# Patient Record
Sex: Male | Born: 1980 | Race: Black or African American | Hispanic: No | Marital: Single | Smoking: Never smoker
Health system: Southern US, Community
[De-identification: ages and names within clinical notes are randomized; demographics above are authoritative.]

## PROBLEM LIST (undated history)

## (undated) DIAGNOSIS — J40 Bronchitis, not specified as acute or chronic: Secondary | ICD-10-CM

---

## 2019-05-17 ENCOUNTER — Other Ambulatory Visit: Payer: Self-pay

## 2019-05-17 ENCOUNTER — Emergency Department (HOSPITAL_BASED_OUTPATIENT_CLINIC_OR_DEPARTMENT_OTHER): Payer: HRSA Program

## 2019-05-17 ENCOUNTER — Encounter (HOSPITAL_BASED_OUTPATIENT_CLINIC_OR_DEPARTMENT_OTHER): Payer: Self-pay | Admitting: Emergency Medicine

## 2019-05-17 ENCOUNTER — Emergency Department (HOSPITAL_BASED_OUTPATIENT_CLINIC_OR_DEPARTMENT_OTHER)
Admission: EM | Admit: 2019-05-17 | Discharge: 2019-05-17 | Disposition: A | Discharge status: Home / Self Care | Payer: HRSA Program | Attending: Emergency Medicine | Admitting: Emergency Medicine

## 2019-05-17 DIAGNOSIS — J069 Acute upper respiratory infection, unspecified: Secondary | ICD-10-CM

## 2019-05-17 DIAGNOSIS — U071 COVID-19: Secondary | ICD-10-CM | POA: Insufficient documentation

## 2019-05-17 DIAGNOSIS — R531 Weakness: Secondary | ICD-10-CM | POA: Insufficient documentation

## 2019-05-17 DIAGNOSIS — R05 Cough: Secondary | ICD-10-CM | POA: Diagnosis present

## 2019-05-17 HISTORY — DX: Bronchitis, not specified as acute or chronic: J40

## 2019-05-17 LAB — CBC WITH DIFFERENTIAL/PLATELET
Abs Immature Granulocytes: 0.02 10*3/uL (ref 0.00–0.07)
Basophils Absolute: 0 10*3/uL (ref 0.0–0.1)
Basophils Relative: 0 %
Eosinophils Absolute: 0 10*3/uL (ref 0.0–0.5)
Eosinophils Relative: 0 %
HCT: 49.2 % (ref 39.0–52.0)
Hemoglobin: 16.5 g/dL (ref 13.0–17.0)
Immature Granulocytes: 0 %
Lymphocytes Relative: 22 %
Lymphs Abs: 1.1 10*3/uL (ref 0.7–4.0)
MCH: 31.9 pg (ref 26.0–34.0)
MCHC: 33.5 g/dL (ref 30.0–36.0)
MCV: 95 fL (ref 80.0–100.0)
Monocytes Absolute: 0.7 10*3/uL (ref 0.1–1.0)
Monocytes Relative: 14 %
Neutro Abs: 3 10*3/uL (ref 1.7–7.7)
Neutrophils Relative %: 64 %
Platelets: 194 10*3/uL (ref 150–400)
RBC: 5.18 MIL/uL (ref 4.22–5.81)
RDW: 13.3 % (ref 11.5–15.5)
WBC: 4.7 10*3/uL (ref 4.0–10.5)
nRBC: 0 % (ref 0.0–0.2)

## 2019-05-17 LAB — BASIC METABOLIC PANEL
Anion gap: 10 (ref 5–15)
BUN: 11 mg/dL (ref 6–20)
CO2: 26 mmol/L (ref 22–32)
Calcium: 9.2 mg/dL (ref 8.9–10.3)
Chloride: 101 mmol/L (ref 98–111)
Creatinine, Ser: 1.36 mg/dL — ABNORMAL HIGH (ref 0.61–1.24)
GFR calc Af Amer: >60 mL/min (ref >60)
GFR calc non Af Amer: >60 mL/min (ref >60)
Glucose, Bld: 117 mg/dL — ABNORMAL HIGH (ref 70–99)
Potassium: 3.7 mmol/L (ref 3.5–5.1)
Sodium: 137 mmol/L (ref 135–145)

## 2019-05-17 NOTE — ED Triage Notes (Signed)
Pt c/o cough, weakness and chills x 4 days.  Pt denies exposure to others with illness. Pt has tried OTC medications without relief.

## 2019-05-17 NOTE — ED Provider Notes (Addendum)
[facility] EMERGENCY DEPARTMENT Provider Note   CSN: [id number] Arrival date & time: 05/17/19  1334    History   Chief Complaint Chief Complaint  Patient presents with  . Cough  . Weakness  . Chills    HPI Rodney Lutz is a 38 y.o. male.     38yo male presents with 4 days of cough (producitve- yellow sputum) with chills, sweats, has not checked temperature, body aches, congestion, sore throat (not currently). No sick contacts, no recent travel. Denies nausea, vomiting, diarrhea, changes in bladder habits, no changes in appetite, smell/taste, denies rash. Patient is taking Mucinex and Robitussin with limited relief. Last took Advil this morning (around 10AM).  Rodney GammaGar-Yon-Ded-Weh Bach was evaluated in Emergency Department on 05/17/2019 for the symptoms described in the history of present illness. He was evaluated in the context of the global COVID-19 pandemic, which necessitated consideration that the patient might be at risk for infection with the SARS-CoV-2 virus that causes COVID-19. Institutional protocols and algorithms that pertain to the evaluation of patients at risk for COVID-19 are in a state of rapid change based on information released by regulatory bodies including the CDC and federal and state organizations. These policies and algorithms were followed during the patient's care in the ED.      Past Medical History:  Diagnosis Date  . Bronchitis     There are no active problems to display for this patient.   History reviewed. No pertinent surgical history.      Home Medications    Prior to Admission medications   Medication Sig Start Date End Date Taking? Authorizing Provider  valACYclovir (VALTREX) 1000 MG tablet Take 1,000 mg by mouth as needed.   Yes [provider]    Family History No family history on file.  Social History Social History   Tobacco Use  . Smoking status: Never Smoker  . Smokeless tobacco: Never  Used  Substance Use Topics  . Alcohol use: Yes  . Drug use: Never     Allergies   Patient has no known allergies.   Review of Systems Review of Systems  Constitutional: Positive for chills and diaphoresis. Negative for appetite change and fatigue.  HENT: Positive for congestion and sore throat. Negative for sneezing.   Respiratory: Positive for cough. Negative for shortness of breath.   Cardiovascular: Negative for chest pain.  Gastrointestinal: Negative for abdominal pain, constipation, diarrhea, nausea and vomiting.  Genitourinary: Negative for dysuria, frequency and urgency.  Musculoskeletal: Positive for arthralgias and myalgias.  Skin: Negative for rash and wound.  Allergic/Immunologic: Negative for immunocompromised state.  Neurological: Negative for dizziness and weakness.  Hematological: Negative for adenopathy.  Psychiatric/Behavioral: Negative for confusion.  All other systems reviewed and are negative.    Physical Exam Updated Vital Signs BP (!) 128/106   Pulse 81   Temp 98.8 F (37.1 C) (Oral)   Resp 18   Ht 6' (1.829 m)   Wt 124.7 kg   SpO2 98%   BMI 37.30 kg/m   Physical Exam Vitals signs and nursing note reviewed.  Constitutional:      General: He is not in acute distress.    Appearance: He is well-developed. He is not diaphoretic.  HENT:     Head: Normocephalic and atraumatic.  Eyes:     Conjunctiva/sclera: Conjunctivae normal.  Neck:     Musculoskeletal: Neck supple.  Cardiovascular:     Rate and Rhythm: Normal rate and regular rhythm.     Pulses: Normal  pulses.     Heart sounds: Normal heart sounds.  Pulmonary:     Effort: Pulmonary effort is normal.     Breath sounds: Normal breath sounds.  Skin:    General: Skin is warm and dry.  Neurological:     Mental Status: He is alert and oriented to person, place, and time.  Psychiatric:        Behavior: Behavior normal.      ED Treatments / Results  Labs (all labs ordered are listed,  but only abnormal results are displayed) Labs Reviewed  BASIC METABOLIC PANEL - Abnormal; Notable for the following components:      Result Value   Glucose, Bld 117 (*)    Creatinine, Ser 1.36 (*)    All other components within normal limits  NOVEL CORONAVIRUS, NAA (HOSPITAL ORDER, SEND-OUT TO REF LAB)  CBC WITH DIFFERENTIAL/PLATELET    EKG None  Radiology Dg Chest Port 1 View  Result Date: 05/17/2019 CLINICAL DATA:  Cough, chills, fever and chest congestion for the past 4 days. EXAM: PORTABLE CHEST 1 VIEW COMPARISON:  None. FINDINGS: Poor inspiration. Borderline enlarged cardiac silhouette. Mildly prominent pulmonary vasculature. Clear lungs. Unremarkable bones. IMPRESSION: Borderline cardiomegaly and mild pulmonary vascular congestion. Electronically Signed   By: Claudie Revering M.D.   On: 05/17/2019 14:51    Procedures Procedures (including critical care time)  Medications Ordered in ED Medications - No data to display   Initial Impression / Assessment and Plan / ED Course  I have reviewed the triage vital signs and the nursing notes.  Pertinent labs & imaging results that were available during my care of the patient were reviewed by me and considered in my medical decision making (see chart for details).  Clinical Course as of May 17 1643  Sun Jul 19, 435  6627 38 year old male with history of viral bronchitis presents with complaint of cough x4 days, productive of yellow sputum.  Patient also reports chills and sweats, has not been checking his temperature, reports body aches, congestion and a mild sore throat earlier.  Patient last took Advil just prior to arrival.  Vital signs reviewed, patient is afebrile.  Lung sounds are clear, exam is unremarkable.  Patient denies sick contacts or travel, no known exposure to [place].  CBC is unremarkable, BMP with mildly elevated creatinine 1.36, no prior for comparison.   [LM]  4034 Chest x-ray shows borderline cardiomegaly and mild  pulmonary vascular congestion however this is a portable film. Patient will have send out COVID testing, advised to quarantine at home pending results and to monitor his temperature with a thermometer.  Return to ER for severe or concerning symptoms otherwise follow-up with PCP for routine care and consider repeat two-view chest x-ray if necessary.   [LM]    Clinical Course User Index [LM] Tacy Learn, PA-C      Final Clinical Impressions(s) / ED Diagnoses   Final diagnoses:  Viral URI with cough    ED Discharge Orders    None       Roque Lias 05/17/19 1644    Davonna Belling, MD 05/19/19 0701    Tacy Learn, PA-C 05/26/19 1203    Davonna Belling, MD 05/26/19 [phone]

## 2019-05-17 NOTE — Discharge Instructions (Addendum)
Your COVID test results should be available in 24-72 hours, your results can be found in your MyChart when available.  Use a thermometer to watch your temperature at home. You should stay home for 10 days since symptom onset and at least 72 hours fever free without taking fever reducing medications such as Advil, Tylenol, Ibuprofen.   Follow up with a primary care doctor, referral given if you do not have a primary care provider. Return to the ER for severe or concerning symptoms.

## 2019-05-20 LAB — NOVEL CORONAVIRUS, NAA (HOSP ORDER, SEND-OUT TO REF LAB; TAT 18-24 HRS): SARS-CoV-2, NAA: DETECTED — AB

## 2019-09-09 ENCOUNTER — Emergency Department (HOSPITAL_BASED_OUTPATIENT_CLINIC_OR_DEPARTMENT_OTHER): Payer: Self-pay

## 2019-09-09 ENCOUNTER — Encounter (HOSPITAL_BASED_OUTPATIENT_CLINIC_OR_DEPARTMENT_OTHER): Payer: Self-pay | Admitting: *Deleted

## 2019-09-09 ENCOUNTER — Other Ambulatory Visit: Payer: Self-pay

## 2019-09-09 ENCOUNTER — Emergency Department (HOSPITAL_BASED_OUTPATIENT_CLINIC_OR_DEPARTMENT_OTHER)
Admission: EM | Admit: 2019-09-09 | Discharge: 2019-09-09 | Disposition: A | Discharge status: Home / Self Care | Payer: Self-pay | Attending: Emergency Medicine | Admitting: Emergency Medicine

## 2019-09-09 DIAGNOSIS — R112 Nausea with vomiting, unspecified: Secondary | ICD-10-CM | POA: Insufficient documentation

## 2019-09-09 DIAGNOSIS — R55 Syncope and collapse: Secondary | ICD-10-CM | POA: Insufficient documentation

## 2019-09-09 DIAGNOSIS — U071 COVID-19: Secondary | ICD-10-CM | POA: Insufficient documentation

## 2019-09-09 DIAGNOSIS — R5383 Other fatigue: Secondary | ICD-10-CM | POA: Insufficient documentation

## 2019-09-09 DIAGNOSIS — R42 Dizziness and giddiness: Secondary | ICD-10-CM | POA: Insufficient documentation

## 2019-09-09 LAB — COMPREHENSIVE METABOLIC PANEL
ALT: 37 U/L (ref 0–44)
AST: 33 U/L (ref 15–41)
Albumin: 4.4 g/dL (ref 3.5–5.0)
Alkaline Phosphatase: 57 U/L (ref 38–126)
Anion gap: 12 (ref 5–15)
BUN: 16 mg/dL (ref 6–20)
CO2: 27 mmol/L (ref 22–32)
Calcium: 9.9 mg/dL (ref 8.9–10.3)
Chloride: 97 mmol/L — ABNORMAL LOW (ref 98–111)
Creatinine, Ser: 1.09 mg/dL (ref 0.61–1.24)
GFR calc Af Amer: >60 mL/min (ref >60)
GFR calc non Af Amer: >60 mL/min (ref >60)
Glucose, Bld: 109 mg/dL — ABNORMAL HIGH (ref 70–99)
Potassium: 3.9 mmol/L (ref 3.5–5.1)
Sodium: 136 mmol/L (ref 135–145)
Total Bilirubin: 0.9 mg/dL (ref 0.3–1.2)
Total Protein: 8.3 g/dL — ABNORMAL HIGH (ref 6.5–8.1)

## 2019-09-09 LAB — CBC WITH DIFFERENTIAL/PLATELET
Abs Immature Granulocytes: 0.05 10*3/uL (ref 0.00–0.07)
Basophils Absolute: 0 10*3/uL (ref 0.0–0.1)
Basophils Relative: 0 %
Eosinophils Absolute: 0 10*3/uL (ref 0.0–0.5)
Eosinophils Relative: 0 %
HCT: 46.8 % (ref 39.0–52.0)
Hemoglobin: 15.6 g/dL (ref 13.0–17.0)
Immature Granulocytes: 0 %
Lymphocytes Relative: 12 %
Lymphs Abs: 1.7 10*3/uL (ref 0.7–4.0)
MCH: 31 pg (ref 26.0–34.0)
MCHC: 33.3 g/dL (ref 30.0–36.0)
MCV: 93 fL (ref 80.0–100.0)
Monocytes Absolute: 1 10*3/uL (ref 0.1–1.0)
Monocytes Relative: 7 %
Neutro Abs: 10.8 10*3/uL — ABNORMAL HIGH (ref 1.7–7.7)
Neutrophils Relative %: 81 %
Platelets: 318 10*3/uL (ref 150–400)
RBC: 5.03 MIL/uL (ref 4.22–5.81)
RDW: 12.9 % (ref 11.5–15.5)
WBC: 13.5 10*3/uL — ABNORMAL HIGH (ref 4.0–10.5)
nRBC: 0 % (ref 0.0–0.2)

## 2019-09-09 LAB — LIPASE, BLOOD: Lipase: 16 U/L (ref 11–51)

## 2019-09-09 MED ORDER — SODIUM CHLORIDE 0.9 % IV BOLUS
1000.0000 mL | Freq: Once | INTRAVENOUS | Status: AC
  Administered 2019-09-09: 18:00:00 1000 mL via INTRAVENOUS

## 2019-09-09 MED ORDER — MECLIZINE HCL 25 MG PO TABS: 25.0000 mg | ORAL_TABLET | Freq: Three times a day (TID) | ORAL | 0 refills | Status: AC | PRN

## 2019-09-09 MED ORDER — MECLIZINE HCL 25 MG PO TABS
25.0000 mg | ORAL_TABLET | Freq: Once | ORAL | Status: AC
  Administered 2019-09-09: 18:00:00 25 mg via ORAL
  Filled 2019-09-09: qty 1

## 2019-09-09 NOTE — ED Notes (Signed)
Patient ambulated one lap around nursing station; states dizziness has improved. Gave patient gingerale and crackers.

## 2019-09-09 NOTE — Discharge Instructions (Signed)
Continue meclizine as needed for symptoms.  Stay well-hydrated and rested.  Please return to the ED if your symptoms worsen.

## 2019-09-09 NOTE — ED Triage Notes (Signed)
Pt c/o unwitnessed syncopal episode last pm, c/o dizziness and vomiting

## 2019-09-09 NOTE — ED Provider Notes (Signed)
[facility] EMERGENCY DEPARTMENT Provider Note   CSN: [id number] Arrival date & time: 09/09/19  1559     History   Chief Complaint Chief Complaint  Patient presents with  . Dizziness    HPI Rodney Lutz is a 38 y.o. male.     The history is provided by the patient.  Dizziness Quality:  Room spinning Severity:  Mild Onset quality:  Gradual Timing:  Intermittent Progression:  Waxing and waning Chronicity:  New Context: head movement   Relieved by:  Nothing Worsened by:  Nothing Associated symptoms: syncope (increased fatigue, was to tired to drive home from bar last night and slept in his car after feeling dizzy. Did not think he was drunk. Has felt tired and nauseau today.)   Associated symptoms: no blood in stool, no chest pain, no diarrhea, no headaches, no hearing loss, no palpitations, no shortness of breath, no tinnitus, no vision changes, no vomiting and no weakness   Risk factors: no hx of vertigo     Past Medical History:  Diagnosis Date  . Bronchitis     There are no active problems to display for this patient.   History reviewed. No pertinent surgical history.      Home Medications    Prior to Admission medications   Medication Sig Start Date End Date Taking? Authorizing Provider  meclizine (ANTIVERT) 25 MG tablet Take 1 tablet (25 mg total) by mouth 3 (three) times daily as needed for dizziness. 09/09/19   Kae Lauman, DO  valACYclovir (VALTREX) 1000 MG tablet Take 1,000 mg by mouth as needed.    [provider]    Family History History reviewed. No pertinent family history.  Social History Social History   Tobacco Use  . Smoking status: Never Smoker  . Smokeless tobacco: Never Used  Substance Use Topics  . Alcohol use: Yes  . Drug use: Never     Allergies   Patient has no known allergies.   Review of Systems Review of Systems  Constitutional: Positive for fatigue. Negative for chills and fever.   HENT: Negative for ear pain, hearing loss, sore throat and tinnitus.   Eyes: Negative for pain and visual disturbance.  Respiratory: Negative for cough and shortness of breath.   Cardiovascular: Positive for syncope (increased fatigue, was to tired to drive home from bar last night and slept in his car after feeling dizzy. Did not think he was drunk. Has felt tired and nauseau today.). Negative for chest pain and palpitations.  Gastrointestinal: Negative for abdominal pain, blood in stool, diarrhea and vomiting.  Genitourinary: Negative for dysuria and hematuria.  Musculoskeletal: Negative for arthralgias and back pain.  Skin: Negative for color change and rash.  Neurological: Positive for dizziness. Negative for tremors, seizures, syncope, facial asymmetry, speech difficulty, weakness, light-headedness, numbness and headaches.  All other systems reviewed and are negative.    Physical Exam Updated Vital Signs  ED Triage Vitals  Enc Vitals Group     BP 09/09/19 1607 (!) 137/95     Pulse Rate 09/09/19 1607 90     Resp 09/09/19 1607 16     Temp 09/09/19 1607 98.9 F (37.2 C)     Temp src --      SpO2 09/09/19 1607 99 %     Weight 09/09/19 1605 275 lb (124.7 kg)     Height 09/09/19 1605 6\' 1"  (1.854 m)     Head Circumference --      Peak Flow --  Pain Score 09/09/19 1605 0     Pain Loc --      Pain Edu? --      Excl. in GC? --     Physical Exam Vitals signs and nursing note reviewed.  Constitutional:      General: He is not in acute distress.    Appearance: He is well-developed. He is not ill-appearing.  HENT:     Head: Normocephalic and atraumatic.     Right Ear: Tympanic membrane normal.     Left Ear: Tympanic membrane normal.     Mouth/Throat:     Mouth: Mucous membranes are moist.  Eyes:     Extraocular Movements: Extraocular movements intact.     Conjunctiva/sclera: Conjunctivae normal.     Pupils: Pupils are equal, round, and reactive to light.  Neck:      Musculoskeletal: Neck supple.  Cardiovascular:     Rate and Rhythm: Normal rate and regular rhythm.     Heart sounds: No murmur.  Pulmonary:     Effort: Pulmonary effort is normal. No respiratory distress.     Breath sounds: Normal breath sounds.  Abdominal:     Palpations: Abdomen is soft.     Tenderness: There is no abdominal tenderness.  Skin:    General: Skin is warm and dry.  Neurological:     General: No focal deficit present.     Mental Status: He is alert and oriented to person, place, and time.     Cranial Nerves: No cranial nerve deficit.     Sensory: No sensory deficit.     Motor: No weakness.     Coordination: Coordination normal.     Gait: Gait normal.     Comments: 5+ out of 5 strength throughout, normal sensation, normal finger-to-nose finger, no drift, normal heel-to-shin, normal gait      ED Treatments / Results  Labs (all labs ordered are listed, but only abnormal results are displayed) Labs Reviewed  CBC WITH DIFFERENTIAL/PLATELET - Abnormal; Notable for the following components:      Result Value   WBC 13.5 (*)    Neutro Abs 10.8 (*)    All other components within normal limits  COMPREHENSIVE METABOLIC PANEL - Abnormal; Notable for the following components:   Chloride 97 (*)    Glucose, Bld 109 (*)    Total Protein 8.3 (*)    All other components within normal limits  LIPASE, BLOOD    EKG EKG Interpretation  Date/Time:  Wednesday September 09 2019 16:11:33 EST Ventricular Rate:  80 PR Interval:  170 QRS Duration: 94 QT Interval:  382 QTC Calculation: 440 R Axis:   58 Text Interpretation: Normal sinus rhythm Normal ECG Confirmed by Virgina Norfolk [phone]) on 09/09/2019 4:20:42 PM   Radiology Ct Head Wo Contrast  Result Date: 09/09/2019 CLINICAL DATA:  Vertigo, episodically, peripheral. Unwitnessed syncopal episode last evening. Dizziness and vomiting. EXAM: CT HEAD WITHOUT CONTRAST TECHNIQUE: Contiguous axial images were obtained from the  base of the skull through the vertex without intravenous contrast. COMPARISON:  None. FINDINGS: Brain: No intracranial hemorrhage, mass effect, or midline shift. No hydrocephalus. The basilar cisterns are patent. No evidence of territorial infarct or acute ischemia. No extra-axial or intracranial fluid collection. Vascular: No hyperdense vessel or unexpected calcification. Skull: No fracture or focal lesion. Sinuses/Orbits: Paranasal sinuses and mastoid air cells are clear. The visualized orbits are unremarkable. Other: None. IMPRESSION: Unremarkable noncontrast head CT. Electronically Signed   By: Ivette Loyal.D.  On: 09/09/2019 18:54    Procedures Procedures (including critical care time)  Medications Ordered in ED Medications  sodium chloride 0.9 % bolus 1,000 mL (0 mLs Intravenous Stopped 09/09/19 1954)  meclizine (ANTIVERT) tablet 25 mg (25 mg Oral Given 09/09/19 1801)     Initial Impression / Assessment and Plan / ED Course  I have reviewed the triage vital signs and the nursing notes.  Pertinent labs & imaging results that were available during my care of the patient were reviewed by me and considered in my medical decision making (see chart for details).     Vickey SagesGar-Yon-Ded-Weh Christen BameStepney is a [age] year old male with no significant medical history presents the ED with feeling of generalized fatigue, dizziness.  Patient with symptoms since last night.  States that he was at a bar drinking when he went back to his car he felt kind of lightheaded and dizzy and fell asleep in his car for several hours.  He woke up still not feeling well.  Had 2 episodes of emesis.  Has continued to have some lethargy throughout the day.  Some dizziness.  Seems to get worse with head movement.  Possibly vertigo.  Has normal neurological exam.  Is able to ambulate without much issue.  No concern for stroke at this time.  Possibly peripheral vertigo versus dehydration versus condition related to alcohol.  Denies  any drug use.  Lab work showed no significant anemia, electrolyte abnormality, kidney injury.  Did have Covid several months ago.  But has felt well since.  Does not have any respiratory or GI symptoms.  No cough.  CT scan of the head was unremarkable.  Patient felt much improved after IV fluids and meclizine.  Overall may be mild vertigo versus alcohol related.  Low concern for stroke at this time.  Given return precautions and discharged in ED in good condition.  EKG showed sinus rhythm.  No chest pain.  Doubt cardiac process.  This chart was dictated using voice recognition software.  Despite best efforts to proofread,  errors can occur which can change the documentation meaning.    Final Clinical Impressions(s) / ED Diagnoses   Final diagnoses:  Dizziness    ED Discharge Orders         Ordered    meclizine (ANTIVERT) 25 MG tablet  3 times daily PRN     09/09/19 2100           Virgina NorfolkCuratolo, Iverson Sees, DO 09/09/19 2102

## 2021-01-23 IMAGING — DX PORTABLE CHEST - 1 VIEW
1 series · 1 of 1 positions shown · non-contrast
Comparison: None.

CLINICAL DATA: Cough, chills, fever and chest congestion for the
past 4 days.

EXAM:
PORTABLE CHEST 1 VIEW

[chest ap]
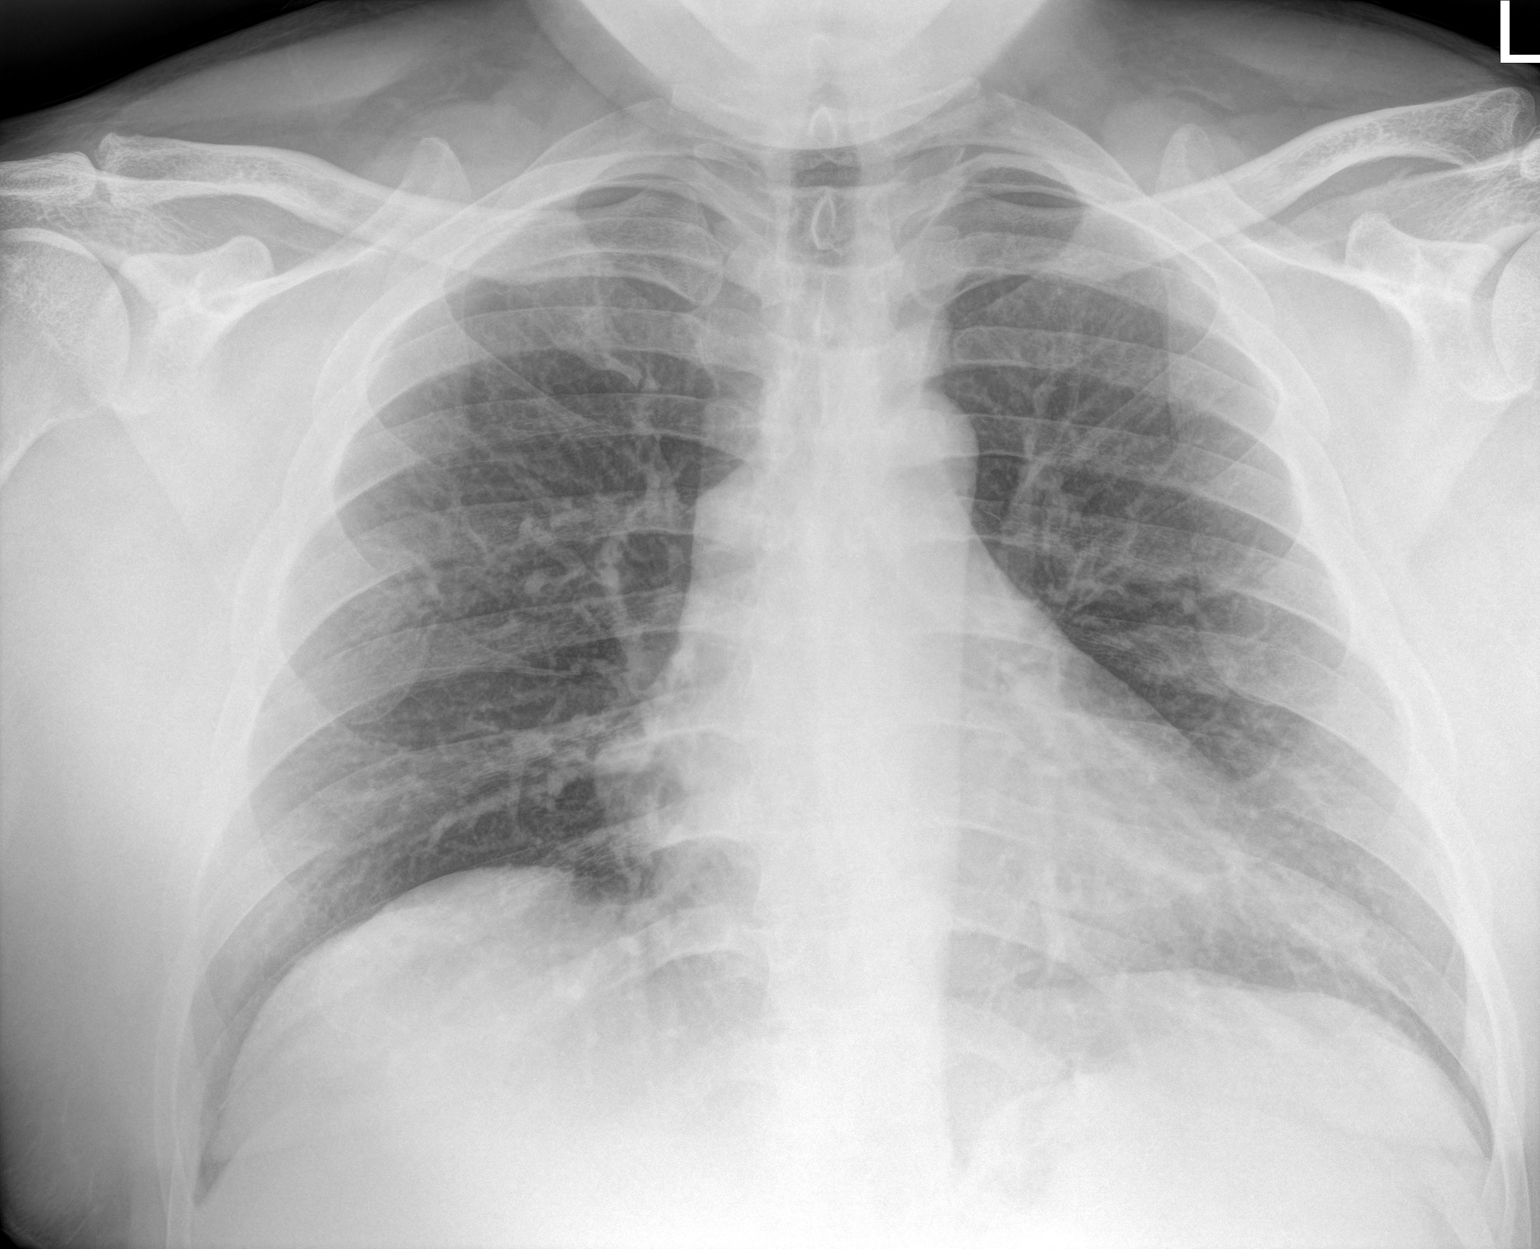

[1 of 1 positions shown; findings below may reference images not displayed]

FINDINGS: Poor inspiration. Borderline enlarged cardiac silhouette. Mildly
prominent pulmonary vasculature. Clear lungs. Unremarkable bones.
IMPRESSION: Borderline cardiomegaly and mild pulmonary vascular congestion.

## 2024-05-06 ENCOUNTER — Emergency Department (HOSPITAL_BASED_OUTPATIENT_CLINIC_OR_DEPARTMENT_OTHER): Payer: Self-pay

## 2024-05-06 ENCOUNTER — Encounter (HOSPITAL_BASED_OUTPATIENT_CLINIC_OR_DEPARTMENT_OTHER): Payer: Self-pay | Admitting: Emergency Medicine

## 2024-05-06 ENCOUNTER — Other Ambulatory Visit: Payer: Self-pay

## 2024-05-06 ENCOUNTER — Emergency Department (HOSPITAL_BASED_OUTPATIENT_CLINIC_OR_DEPARTMENT_OTHER)
Admission: EM | Admit: 2024-05-06 | Discharge: 2024-05-06 | Disposition: A | Discharge status: Home / Self Care | Payer: Self-pay

## 2024-05-06 DIAGNOSIS — Z79899 Other long term (current) drug therapy: Secondary | ICD-10-CM | POA: Diagnosis not present

## 2024-05-06 DIAGNOSIS — I10 Essential (primary) hypertension: Secondary | ICD-10-CM | POA: Insufficient documentation

## 2024-05-06 DIAGNOSIS — R42 Dizziness and giddiness: Secondary | ICD-10-CM | POA: Diagnosis present

## 2024-05-06 DIAGNOSIS — G629 Polyneuropathy, unspecified: Secondary | ICD-10-CM | POA: Diagnosis not present

## 2024-05-06 LAB — PROTIME-INR
INR: 1 (ref 0.8–1.2)
Prothrombin Time: 13.2 s (ref 11.4–15.2)

## 2024-05-06 LAB — CBC
HCT: 43.2 % (ref 39.0–52.0)
Hemoglobin: 15.2 g/dL (ref 13.0–17.0)
MCH: 35.9 pg — ABNORMAL HIGH (ref 26.0–34.0)
MCHC: 35.2 g/dL (ref 30.0–36.0)
MCV: 102.1 fL — ABNORMAL HIGH (ref 80.0–100.0)
Platelets: 390 K/uL (ref 150–400)
RBC: 4.23 MIL/uL (ref 4.22–5.81)
RDW: 14.3 % (ref 11.5–15.5)
WBC: 9.5 K/uL (ref 4.0–10.5)
nRBC: 0 % (ref 0.0–0.2)

## 2024-05-06 LAB — URINALYSIS, MICROSCOPIC (REFLEX)

## 2024-05-06 LAB — COMPREHENSIVE METABOLIC PANEL WITH GFR
ALT: 43 U/L (ref 0–44)
AST: 77 U/L — ABNORMAL HIGH (ref 15–41)
Albumin: 4.4 g/dL (ref 3.5–5.0)
Alkaline Phosphatase: 76 U/L (ref 38–126)
Anion gap: 16 — ABNORMAL HIGH (ref 5–15)
BUN: <5 mg/dL — ABNORMAL LOW (ref 6–20)
CO2: 27 mmol/L (ref 22–32)
Calcium: 9.4 mg/dL (ref 8.9–10.3)
Chloride: 96 mmol/L — ABNORMAL LOW (ref 98–111)
Creatinine, Ser: 0.83 mg/dL (ref 0.61–1.24)
GFR, Estimated: >60 mL/min (ref >60)
Glucose, Bld: 102 mg/dL — ABNORMAL HIGH (ref 70–99)
Potassium: 3.1 mmol/L — ABNORMAL LOW (ref 3.5–5.1)
Sodium: 140 mmol/L (ref 135–145)
Total Bilirubin: 1.3 mg/dL — ABNORMAL HIGH (ref 0.0–1.2)
Total Protein: 7.8 g/dL (ref 6.5–8.1)

## 2024-05-06 LAB — FOLATE: Folate: 5.3 ng/mL — ABNORMAL LOW (ref >5.9)

## 2024-05-06 LAB — URINALYSIS, ROUTINE W REFLEX MICROSCOPIC
Glucose, UA: NEGATIVE mg/dL
Hgb urine dipstick: NEGATIVE
Ketones, ur: 15 mg/dL — AB
Leukocytes,Ua: NEGATIVE
Nitrite: NEGATIVE
Protein, ur: 100 mg/dL — AB
Specific Gravity, Urine: 1.025 (ref 1.005–1.030)
pH: 6.5 (ref 5.0–8.0)

## 2024-05-06 LAB — DIFFERENTIAL
Abs Immature Granulocytes: 0.04 K/uL (ref 0.00–0.07)
Basophils Absolute: 0.1 K/uL (ref 0.0–0.1)
Basophils Relative: 1 %
Eosinophils Absolute: 0 K/uL (ref 0.0–0.5)
Eosinophils Relative: 0 %
Immature Granulocytes: 0 %
Lymphocytes Relative: 18 %
Lymphs Abs: 1.7 K/uL (ref 0.7–4.0)
Monocytes Absolute: 0.8 K/uL (ref 0.1–1.0)
Monocytes Relative: 8 %
Neutro Abs: 6.9 K/uL (ref 1.7–7.7)
Neutrophils Relative %: 73 %

## 2024-05-06 LAB — URINE DRUG SCREEN
Amphetamines: NOT DETECTED
Barbiturates: NOT DETECTED
Benzodiazepines: NOT DETECTED
Cocaine: NOT DETECTED
Fentanyl: NOT DETECTED
Methadone Scn, Ur: NOT DETECTED
Opiates: NOT DETECTED
Tetrahydrocannabinol: NOT DETECTED

## 2024-05-06 LAB — CBG MONITORING, ED: Glucose-Capillary: 106 mg/dL — ABNORMAL HIGH (ref 70–99)

## 2024-05-06 LAB — APTT: aPTT: 28 s (ref 24–36)

## 2024-05-06 LAB — ETHANOL: Alcohol, Ethyl (B): <15 mg/dL (ref <15)

## 2024-05-06 LAB — VITAMIN B12: Vitamin B-12: 209 pg/mL (ref 180–914)

## 2024-05-06 MED ORDER — AMLODIPINE BESYLATE 5 MG PO TABS: 5.0000 mg | ORAL_TABLET | Freq: Every day | ORAL | 0 refills | Status: AC

## 2024-05-06 MED ORDER — FOLIC ACID 1 MG PO TABS
1.0000 mg | ORAL_TABLET | Freq: Once | ORAL | Status: AC
  Administered 2024-05-06: 1 mg via ORAL
  Filled 2024-05-06: qty 1

## 2024-05-06 NOTE — Discharge Instructions (Addendum)
 Your MRI did not show any signs of stroke today.  In general your lab work looked okay, your potassium was a bit low.  I am still waiting for results of your vitamin levels.  This will need to be followed up as an outpatient by primary care or by neurology.  I placed a referral on your behalf.  I have sent a prescription for amlodipine  which is a blood pressure medicine.  Please begin to take and follow-up with primary care for blood pressure recheck.  Return if you have weakness in your arms or legs, slurred speech, trouble walking or talking, confusion, or worsening trouble with your balance.   Thank you for the opportunity to take care of you in our Emergency Department. You have been diagnosed with high blood pressure, also known as hypertension. This means that the force of blood against the walls of your blood vessels called is too strong. It also means that your heart has to work harder to move the blood. High blood pressure usually has no symptoms, but over time, it can cause serious health problems such as Heart attack and heart failure Stroke Kidney disease and failure Vision loss With the help from your healthcare provider and some important life style changes, you can manage your blood pressure and protect your health. Please read the instructions provided on hypertension, how to manage it and how to check your blood pressure. Additionally, use the blood pressure log provided to record your blood pressures. Take the blood pressure log with you to your primary care doctor so that they can adjust your blood pressure medications if needed. Please read the instructions on follow-up appointment. Return to the ER or Call 911 right away if you have any of these symptoms: Chest pain or shortness of breath Severe headache Weakness, tingling, or numbness of your face, arms, or legs (especially on 1 side of the body) Sudden change in vision Confusion, trouble speaking, or trouble understanding  speech

## 2024-05-06 NOTE — ED Notes (Signed)
 Patient reports that he has about 3 drinks daily. Drink of choice is Tequila. Denies having to drink in the morning in order to stop that shakes.

## 2024-05-06 NOTE — ED Triage Notes (Signed)
 Pt reports dizziness and loss of balance today since 1000, denies HA, vision or hearing changes; also reports bilateral UE and LE numbness & burning x 2 weeks; hypertensive in triage; neurological assessment WNL

## 2024-05-06 NOTE — ED Provider Notes (Signed)
 [facility] Provider Note   CSN: [id number] Arrival date & time: 05/06/24  1518     Patient presents with: Dizziness   Rodney Lutz is a 43 y.o. male.   Patient with no known past medical history, daily alcohol use --presents to the emergency department for evaluation of feeling off balance as well as burning sensation in his hands and feet.  Patient reports the burning sensation started in his feet 2 or 3 weeks ago and has also began to affect his hands.  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  This sensation has gradually worsened over time.  Patient presents today however because of a off-balance episode he had after getting out of the shower.  This occurred around 10 AM.  Patient reports suddenly feeling very off balance, no lightheadedness or syncope.  He was able to walk.  No associated weakness.  He was able to sit down and symptoms improved.  Patient denies other signs of stroke including: facial droop, slurred speech, aphasia, weakness in extremities.  No associated headache or vision change/loss.       Prior to Admission medications   Medication Sig Start Date End Date Taking? Authorizing Provider  meclizine  (ANTIVERT ) 25 MG tablet Take 1 tablet (25 mg total) by mouth 3 (three) times daily as needed for dizziness. 09/09/19   Curatolo, Adam, DO  valACYclovir (VALTREX) 1000 MG tablet Take 1,000 mg by mouth as needed.    [provider]    Allergies: Patient has no known allergies.    Review of Systems  Updated Vital Signs BP (!) 155/114   Pulse 97   Temp 98.3 F (36.8 C)   Resp 16   Ht 6' 1 (1.854 m)   Wt 127 kg   SpO2 97%   BMI 36.94 kg/m   Physical Exam Vitals and nursing note reviewed.  Constitutional:      Appearance: He is well-developed.  HENT:     Head: Normocephalic and atraumatic.     Right Ear: Tympanic membrane, ear canal and external ear normal.      Left Ear: Tympanic membrane, ear canal and external ear normal.     Nose: Nose normal.     Mouth/Throat:     Mouth: Mucous membranes are moist.     Pharynx: Uvula midline.  Eyes:     General: Lids are normal.     Conjunctiva/sclera: Conjunctivae normal.     Pupils: Pupils are equal, round, and reactive to light.  Cardiovascular:     Rate and Rhythm: Normal rate and regular rhythm.  Pulmonary:     Effort: Pulmonary effort is normal.     Breath sounds: Normal breath sounds.  Abdominal:     Palpations: Abdomen is soft.     Tenderness: There is no abdominal tenderness.  Musculoskeletal:        General: Normal range of motion.     Cervical back: Normal range of motion and neck supple. No tenderness or bony tenderness.  Skin:    General: Skin is warm and dry.  Neurological:     Mental Status: He is alert and oriented to person, place, and time.     GCS: GCS eye subscore is 4. GCS verbal subscore is 5. GCS motor subscore is 6.     Cranial Nerves: No cranial nerve deficit.     Sensory: No sensory deficit.     Motor: No abnormal muscle tone.  Coordination: Romberg sign negative. Coordination normal. Finger-Nose-Finger Test normal.     Deep Tendon Reflexes: Reflexes are normal and symmetric.     Comments: No sensory deficit.      (all labs ordered are listed, but only abnormal results are displayed) Labs Reviewed  COMPREHENSIVE METABOLIC PANEL WITH GFR - Abnormal; Notable for the following components:      Result Value   Potassium 3.1 (*)    Chloride 96 (*)    Glucose, Bld 102 (*)    BUN <5 (*)    AST 77 (*)    Total Bilirubin 1.3 (*)    Anion gap 16 (*)    All other components within normal limits  CBC - Abnormal; Notable for the following components:   MCV 102.1 (*)    MCH 35.9 (*)    All other components within normal limits  URINALYSIS, ROUTINE W REFLEX MICROSCOPIC - Abnormal; Notable for the following components:   Color, Urine AMBER (*)    Bilirubin Urine  MODERATE (*)    Ketones, ur 15 (*)    Protein, ur 100 (*)    All other components within normal limits  URINALYSIS, MICROSCOPIC (REFLEX) - Abnormal; Notable for the following components:   Bacteria, UA RARE (*)    All other components within normal limits  CBG MONITORING, ED - Abnormal; Notable for the following components:   Glucose-Capillary 106 (*)    All other components within normal limits  PROTIME-INR  APTT  ETHANOL  URINE DRUG SCREEN  DIFFERENTIAL  VITAMIN B12  FOLATE  CBG MONITORING, ED    EKG: EKG Interpretation Date/Time:  Wednesday May 06 2024 15:41:17 EDT Ventricular Rate:  108 PR Interval:  156 QRS Duration:  92 QT Interval:  369 QTC Calculation: 495 R Axis:   8  Text Interpretation: Sinus tachycardia Inferior infarct, old Confirmed by Darliss Rogue ([id number]) on 05/06/2024 4:59:52 PM  Radiology: MR BRAIN WO CONTRAST Result Date: 05/06/2024 CLINICAL DATA:  Initial evaluation for acute neuro deficit, stroke suspected. EXAM: MRI HEAD WITHOUT CONTRAST TECHNIQUE: Multiplanar, multiecho pulse sequences of the brain and surrounding structures were obtained without intravenous contrast. COMPARISON:  CT from 09/09/2019. FINDINGS: Brain: Cerebral volume within normal limits for age. No focal parenchymal signal abnormality. No abnormal foci of restricted diffusion to suggest acute or subacute ischemia. Gray-white matter differentiation well maintained. No encephalomalacia to suggest chronic cortical infarction or other insult. No foci of susceptibility artifact indicative of acute or chronic intracranial blood products. No mass lesion, midline shift or mass effect. Ventricles normal in size and morphology without hydrocephalus. No extra-axial fluid collection. Pituitary gland and suprasellar region within normal limits. Vascular: Major intracranial vascular flow voids are well maintained. Skull and upper cervical spine: Craniocervical junction within normal limits. Visualized  upper cervical spine demonstrates no significant finding. Bone marrow signal intensity within normal limits. No scalp soft tissue abnormality. Sinuses/Orbits: Globes and orbital soft tissues are within normal limits. Mild scattered mucosal thickening about the ethmoidal air cells. Small left maxillary sinus retention cyst. No mastoid effusion. Other: None. IMPRESSION: Normal brain MRI. No acute intracranial abnormality identified. Electronically Signed   By: Morene Hoard M.D.   On: 05/06/2024 18:31     Procedures   Medications Ordered in the ED  folic acid  (FOLVITE ) tablet 1 mg (1 mg Oral Given 05/06/24 1635)   ED Course  Patient seen and examined. History obtained directly from patient.   Labs/EKG: Reviewed CBG that was ordered on arrival, was minimally elevated at  106.  Stroke order set ordered including CBC with differential, CMP, ethanol, UDS, PT INR and APTT ordered as part of the order set.  Given neuropathy and alcohol use history, added B12 and folate.  Imaging: Ordered MRI brain.  Medications/Fluids: None ordered  Most recent vital signs reviewed and are as follows: BP (!) 155/114   Pulse 97   Temp 98.3 F (36.8 C)   Resp 16   Ht 6' 1 (1.854 m)   Wt 127 kg   SpO2 97%   BMI 36.94 kg/m   Initial impression: Neuropathy and off-balance episode.  Patient with largely unknown past medical history.  I suspect that given his blood pressure today, he likely has underlying hypertension.  Unknown other risk factors.  He does not routinely see a doctor.  Due to stroke risk factors, would like to rule this out.  MRI is available today and this will be performed.  Other potential etiology includes vitamin deficiency from ongoing alcohol use.  Discussed case with Dr. Simon.   7:03 PM Reassessment performed. Patient appears stable.  Will ambulate.  Labs personally reviewed and interpreted including: CBC with normal white blood cell count and hemoglobin, MCV elevated at 102; CMP  with low potassium at 3.1, glucose 102, AST mildly elevated at 77, T. bili mildly elevated at 1.3; ethanol negative; PT/INR were normal APTT was normal; drug screen negative.  B12 and folate pending.  Imaging personally visualized and interpreted including: MRI brain, agree no acute findings.  Reviewed pertinent lab work and imaging with patient at bedside. Questions answered.   Most current vital signs reviewed and are as follows: BP (!) 168/119   Pulse 95   Temp 98.3 F (36.8 C)   Resp 14   Ht 6' 1 (1.854 m)   Wt 127 kg   SpO2 98%   BMI 36.94 kg/m   Plan: Discharge to home.   Prescriptions written for: Amlodipine   Other home care instructions discussed: Moderation of alcohol, monitoring of symptoms  ED return instructions discussed: Patient counseled to return if they have weakness in their arms or legs, slurred speech, trouble walking or talking, confusion, trouble with their balance, or if they have any other concerns. Patient verbalizes understanding and agrees with plan.   Follow-up instructions discussed: Patient encouraged to follow-up with their PCP in 7 days.                                    Medical Decision Making Amount and/or Complexity of Data Reviewed Labs: ordered. Radiology: ordered.  Risk Prescription drug management.   Patient presented today for evaluation of worsening peripheral neuropathy which started several weeks ago and disequilibrium.  Patient does drink alcohol daily.  Concern for CVA, no evidence on MRI.  No focal neuro deficits on exam.  Unclear etiology of neuropathy.  Awaiting B12 and folate.  Blood sugars are normal and low concern for diabetes.  No indication for emergent further workup or testing, or specialist consultation.  Strongly encouraged outpatient and neurology referral was placed on behalf of patient.  In addition, patient found to have hypertension.  This is likely a chronic problem.  Patient started on amlodipine .  Social  determinants of health affected by poor access to care, no PCP.  Primary care referral provided.  The patient's vital signs, pertinent lab work and imaging were reviewed and interpreted as discussed in the ED course. Hospitalization was considered  for further testing, treatments, or serial exams/observation. However as patient is well-appearing, has a stable exam, and reassuring studies today, I do not feel that they warrant admission at this time. This plan was discussed with the patient who verbalizes agreement and comfort with this plan and seems reliable and able to return to the Emergency Department with worsening or changing symptoms.       Final diagnoses:  Dysequilibrium  Peripheral polyneuropathy  Uncontrolled hypertension    ED Discharge Orders          Ordered    amLODipine  (NORVASC ) 5 MG tablet  Daily        05/06/24 1900    Ambulatory referral to Neurology       Comments: An appointment is requested in approximately: 1 week   05/06/24 1900               Desiderio Chew, DEVONNA 05/06/24 1908    Simon Lavonia SAILOR, MD 05/06/24 2242
